# Patient Record
Sex: Male | Born: 1970 | Race: White | Hispanic: No | Marital: Married | Smoking: Never smoker
Health system: Southern US, Community
[De-identification: ages and names within clinical notes are randomized; demographics above are authoritative.]

---

## 2018-06-13 DIAGNOSIS — Z8371 Family history of colonic polyps: Secondary | ICD-10-CM | POA: Insufficient documentation

## 2018-08-15 ENCOUNTER — Ambulatory Visit (HOSPITAL_COMMUNITY)
Admission: EM | Admit: 2018-08-15 | Discharge: 2018-08-15 | Disposition: A | Discharge status: Home / Self Care | Payer: Federal, State, Local not specified - PPO | Attending: Family Medicine | Admitting: Family Medicine

## 2018-08-15 ENCOUNTER — Encounter (HOSPITAL_COMMUNITY): Payer: Self-pay | Admitting: Emergency Medicine

## 2018-08-15 ENCOUNTER — Ambulatory Visit (INDEPENDENT_AMBULATORY_CARE_PROVIDER_SITE_OTHER): Payer: Federal, State, Local not specified - PPO

## 2018-08-15 DIAGNOSIS — J9811 Atelectasis: Secondary | ICD-10-CM | POA: Diagnosis not present

## 2018-08-15 DIAGNOSIS — J22 Unspecified acute lower respiratory infection: Secondary | ICD-10-CM | POA: Diagnosis not present

## 2018-08-15 MED ORDER — BENZONATATE 100 MG PO CAPS: 100.0000 mg | ORAL_CAPSULE | Freq: Three times a day (TID) | ORAL | 0 refills | Status: DC

## 2018-08-15 MED ORDER — AZITHROMYCIN 250 MG PO TABS: 250.0000 mg | ORAL_TABLET | Freq: Every day | ORAL | 0 refills | Status: DC

## 2018-08-15 NOTE — ED Provider Notes (Signed)
[facility]   [id number] 08/15/18 Arrival Time: 1355   CC: Cough  SUBJECTIVE:  Alan Clark is a 47 y.o. male who presents with cough x 1 week ago that has worsened in the last couple of days.  Admits to positive sick exposure to kids at home.  Describes cough as intermittent and sometimes productive with yellow sputum.  Has tried OTC cough medication without relief.  Symptoms are made worse with lying down at night.  Reports previous symptoms in the past. Complains of fever, with Tmax of 102 at home, chills, fatigue, wheezing, and nasal congestion.  Denies sinus pain, rhinorrhea, sore throat, SOB, chest pain, nausea, changes in bowel or bladder habits.    ROS: As per HPI.  History reviewed. No pertinent past medical history. History reviewed. No pertinent surgical history. No Known Allergies No current facility-administered medications on file prior to encounter.    No current outpatient medications on file prior to encounter.    Social History   Socioeconomic History  . Marital status: Married    Spouse name: Not on file  . Number of children: Not on file  . Years of education: Not on file  . Highest education level: Not on file  Occupational History  . Not on file  Social Needs  . Financial resource strain: Not on file  . Food insecurity:    Worry: Not on file    Inability: Not on file  . Transportation needs:    Medical: Not on file    Non-medical: Not on file  Tobacco Use  . Smoking status: Never Smoker  . Smokeless tobacco: Never Used  Substance and Sexual Activity  . Alcohol use: Not on file  . Drug use: Not on file  . Sexual activity: Not on file  Lifestyle  . Physical activity:    Days per week: Not on file    Minutes per session: Not on file  . Stress: Not on file  Relationships  . Social connections:    Talks on phone: Not on file    Gets together: Not on file    Attends religious service: Not on file    Active member of club or  organization: Not on file    Attends meetings of clubs or organizations: Not on file    Relationship status: Not on file  . Intimate partner violence:    Fear of current or ex partner: Not on file    Emotionally abused: Not on file    Physically abused: Not on file    Forced sexual activity: Not on file  Other Topics Concern  . Not on file  Social History Narrative  . Not on file   History reviewed. No pertinent family history.   OBJECTIVE:  Vitals:   08/15/18 1424  BP: 112/68  Pulse: 78  Resp: 16  Temp: 98.2 F (36.8 C)  TempSrc: Oral  SpO2: 98%    General appearance: AOx3 in no acute distress; appears fatigued; nontoxic HEENT: NCAT; EACs clear, TMs pearly gray; PERRL.  EOM grossly intact.  No obvious clear rhinorrhea; tonsils nonerythematous, uvula midline Neck: supple without LAD Lungs: wheezes heard over the right lung field Heart: regular rate and rhythm.  Radial pulses 2+ symmetrical bilaterally Skin: warm and dry Psychological: alert and cooperative; normal mood and affect  DIAGNOSTIC STUDIES:  Dg Chest 2 View  Result Date: 08/15/2018 CLINICAL DATA:  Cough for 2 weeks EXAM: CHEST - 2 VIEW COMPARISON:  None. FINDINGS: There Is atelectatic change  in the right base. There is no edema or consolidation. Heart size and pulmonary vascularity are normal. No adenopathy. No bone lesions. IMPRESSION: Right base atelectasis. No edema or consolidation. No evident adenopathy. Electronically Signed   By: Bretta Bang III M.D.   On: 08/15/2018 15:10    ASSESSMENT & PLAN:  1. Lower respiratory infection (e.g., bronchitis, pneumonia, pneumonitis, pulmonitis)   2. Atelectasis     Meds ordered this encounter  Medications  . azithromycin (ZITHROMAX) 250 MG tablet    Sig: Take 1 tablet (250 mg total) by mouth daily. Take first 2 tablets together, then 1 every day until finished.    Dispense:  6 tablet    Refill:  0    Order Specific Question:   Supervising Provider     Answer:   Isa Rankin [phone]  . benzonatate (TESSALON) 100 MG capsule    Sig: Take 1 capsule (100 mg total) by mouth every 8 (eight) hours.    Dispense:  21 capsule    Refill:  0    Order Specific Question:   Supervising Provider    Answer:   Isa Rankin [[id number]]   Chest xray showed atelectasis  Get plenty of rest and push fluids Use OTC medication as needed for symptomatic relief Take antibiotic as directed and to completion Prescribed tessolone perles as needed for cough Follow up with PCP if symptoms persist Return or go to ER if you have any new or worsening symptoms  Repeat chest x-ray in 6 weeks to ensure atelectasis has resolved  Reviewed expectations re: course of current medical issues. Questions answered. Outlined signs and symptoms indicating need for more acute intervention. Patient verbalized understanding. After Visit Summary given.          Rennis Harding, PA-C 08/15/18 1526

## 2018-08-15 NOTE — ED Notes (Signed)
Bed: UC01 Expected date:  Expected time:  Means of arrival:  Comments: appointments 

## 2018-08-15 NOTE — ED Triage Notes (Signed)
Pt here for URI sx not resolving and fever

## 2018-08-15 NOTE — Discharge Instructions (Addendum)
Chest xray showed atelectasis  Get plenty of rest and push fluids Use OTC medication as needed for symptomatic relief Take antibiotic as directed and to completion Prescribed tessolone perles as needed for cough Follow up with PCP if symptoms persist Return or go to ER if you have any new or worsening symptoms  Repeat chest x-ray in 6 weeks to ensure atelectasis has resolved

## 2020-02-04 ENCOUNTER — Ambulatory Visit
Admission: RE | Admit: 2020-02-04 | Discharge: 2020-02-04 | Disposition: A | Discharge status: Home / Self Care | Payer: Federal, State, Local not specified - PPO | Source: Ambulatory Visit | Attending: Orthopedic Surgery | Admitting: Orthopedic Surgery

## 2020-02-04 ENCOUNTER — Other Ambulatory Visit: Payer: Self-pay

## 2020-02-04 ENCOUNTER — Other Ambulatory Visit: Payer: Self-pay | Admitting: Orthopedic Surgery

## 2020-02-04 DIAGNOSIS — S0542XA Penetrating wound of orbit with or without foreign body, left eye, initial encounter: Secondary | ICD-10-CM

## 2020-02-04 DIAGNOSIS — S0541XA Penetrating wound of orbit with or without foreign body, right eye, initial encounter: Secondary | ICD-10-CM

## 2021-02-18 IMAGING — CR DG ORBITS FOR FOREIGN BODY
2 series · 2 of 2 positions shown · non-contrast
Comparison: None.

CLINICAL DATA: Metal working/exposure; clearance prior to MRI

EXAM:
ORBITS FOR FOREIGN BODY - 2 VIEW

[w orbit pa (1 of 2)]
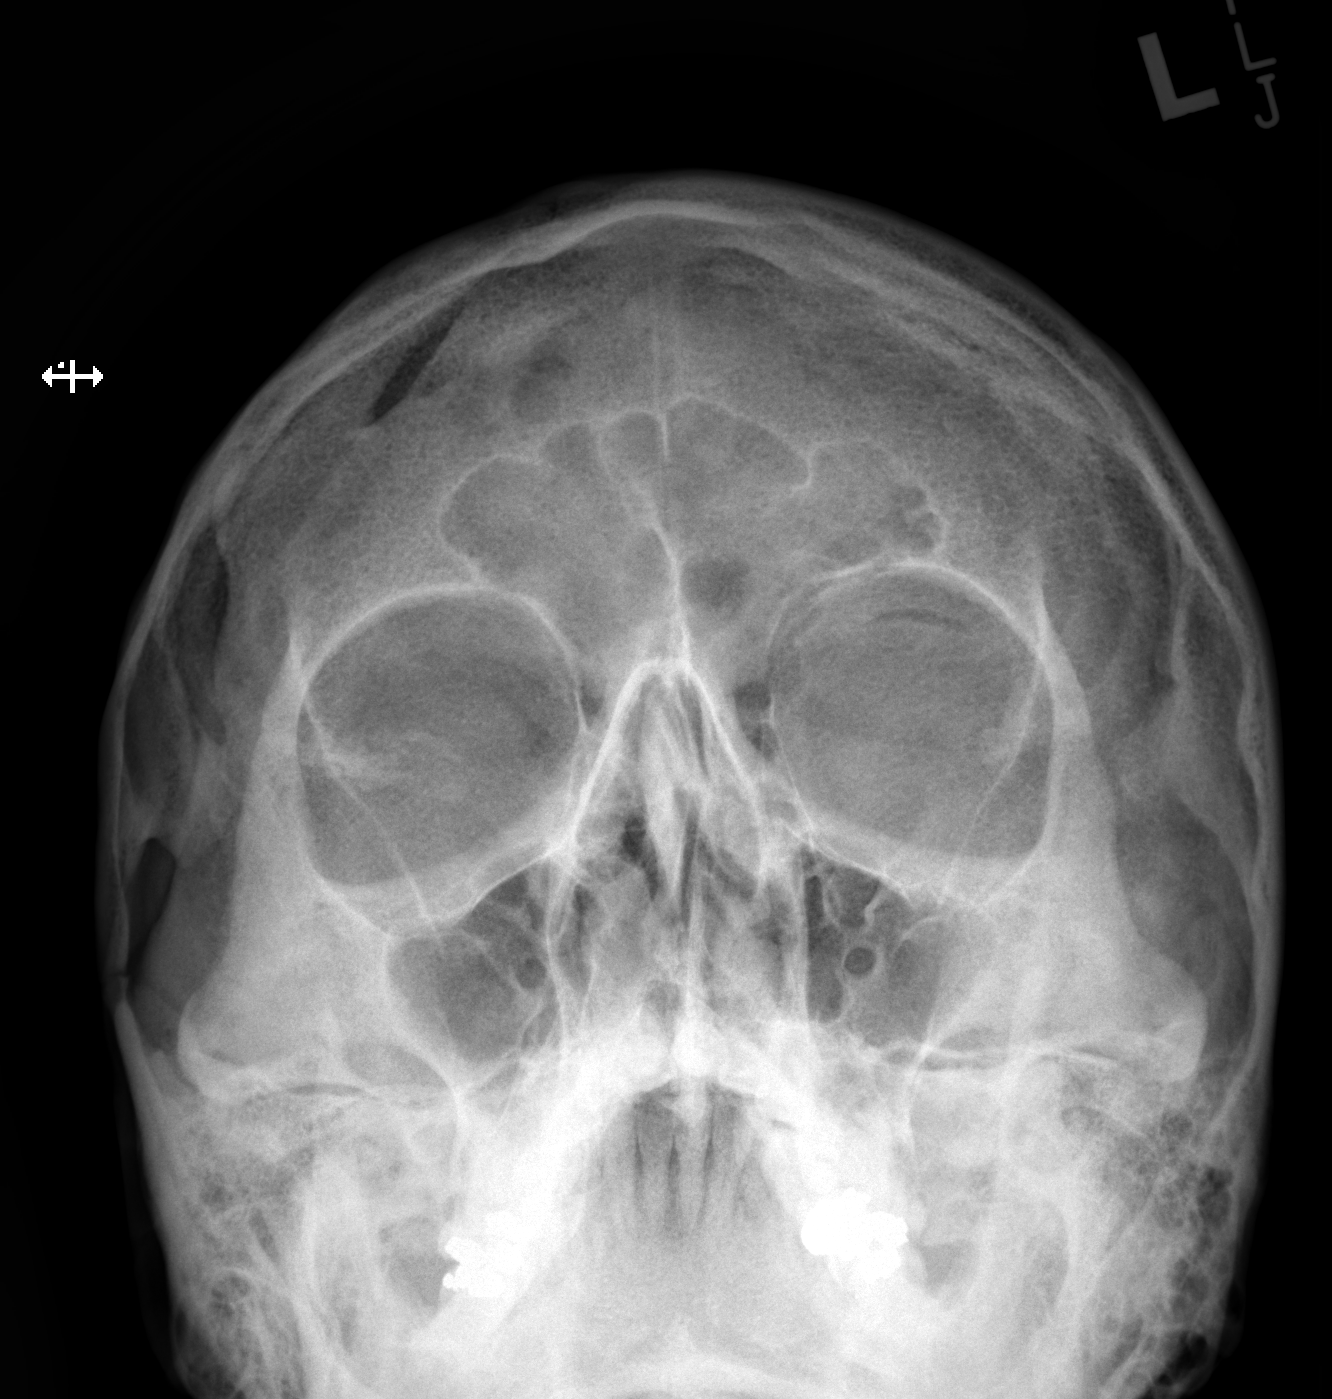

[w orbit pa (2 of 2)]
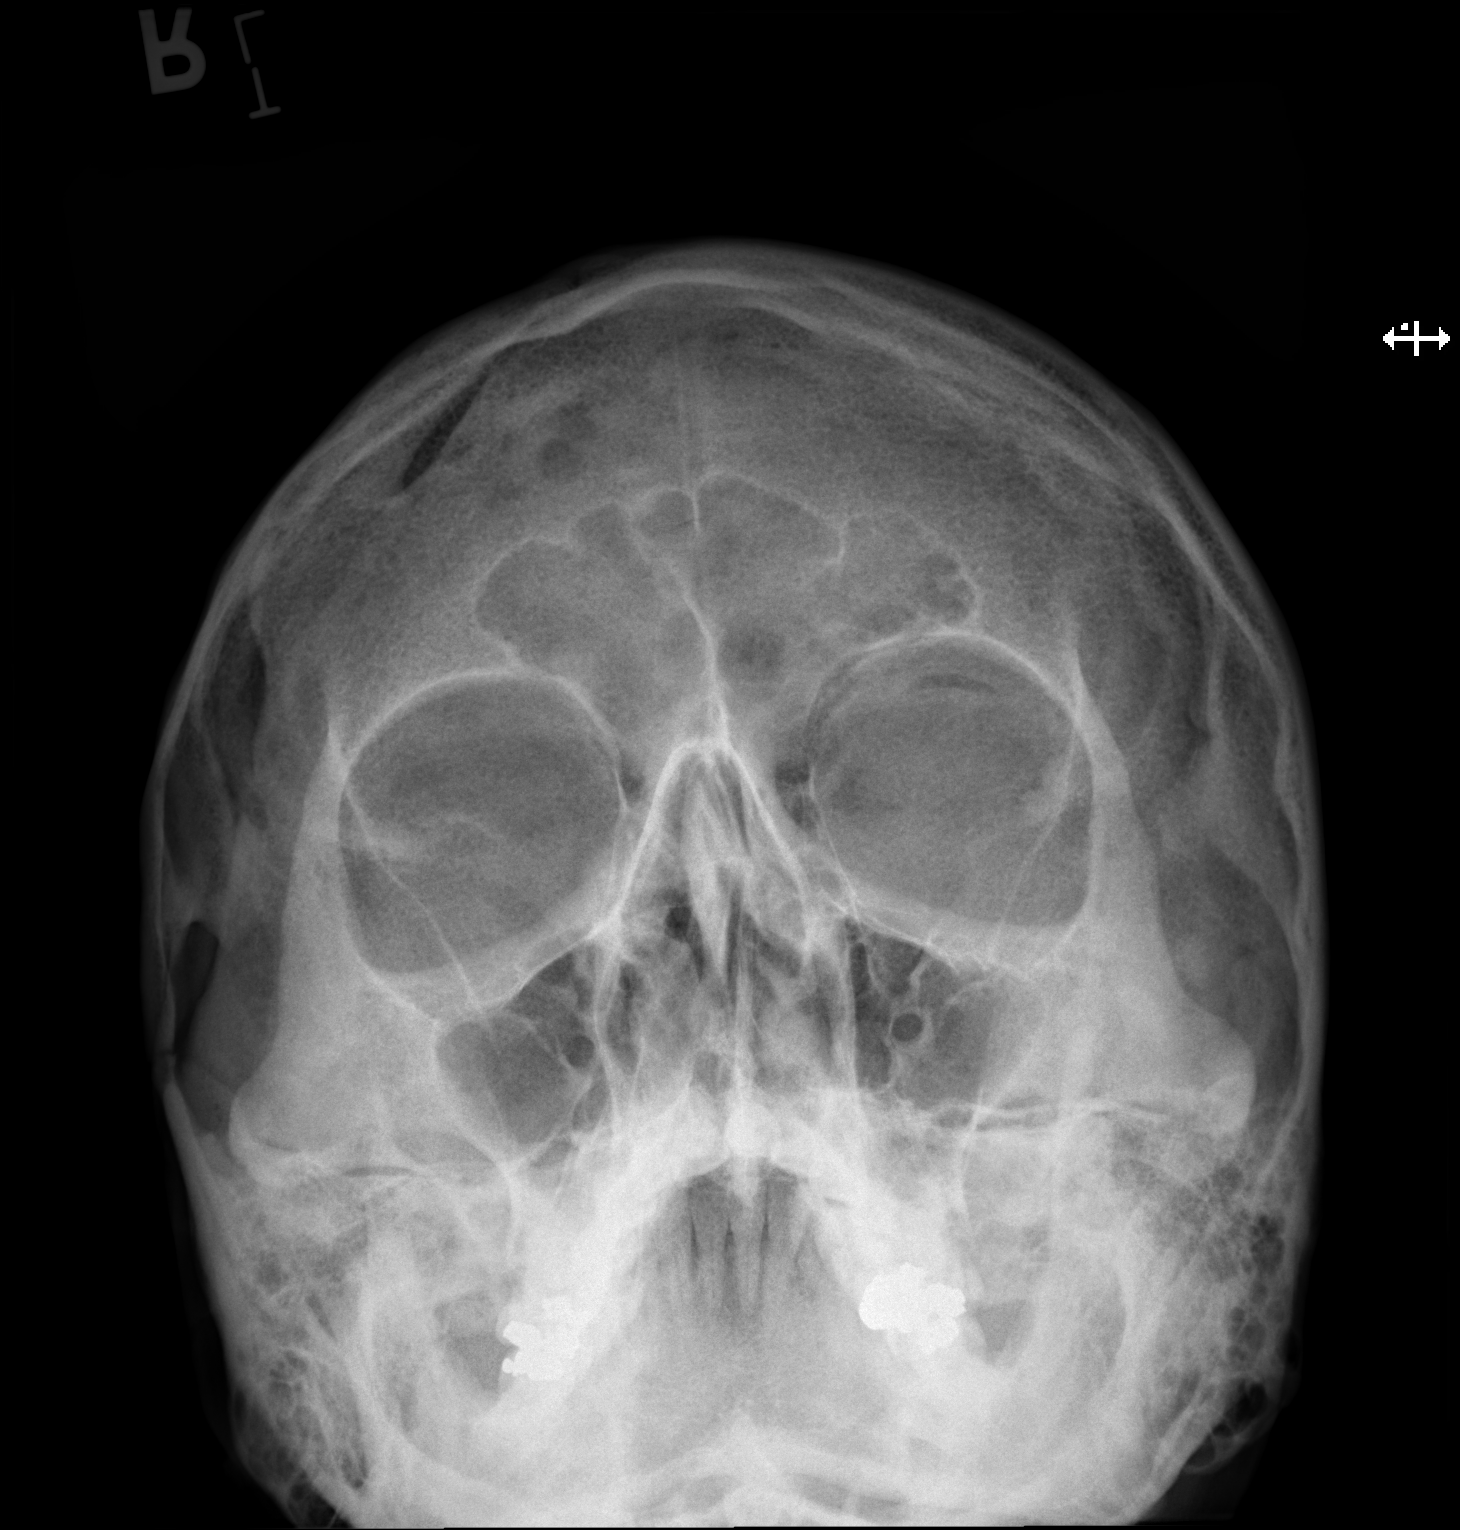

[2 of 2 positions shown; findings below may reference images not displayed]

FINDINGS: There is no evidence of intraorbital metallic foreign body. The
visualized paranasal sinuses are clear. Lucencies projecting over
the right side of the skull may be secondary to previous craniotomy.
IMPRESSION: No evidence of intraorbital metallic foreign body.

## 2022-05-06 ENCOUNTER — Ambulatory Visit: Payer: Self-pay | Admitting: Podiatry

## 2022-05-24 ENCOUNTER — Ambulatory Visit (INDEPENDENT_AMBULATORY_CARE_PROVIDER_SITE_OTHER): Payer: Federal, State, Local not specified - PPO

## 2022-05-24 ENCOUNTER — Ambulatory Visit: Payer: Federal, State, Local not specified - PPO | Admitting: Podiatry

## 2022-05-24 ENCOUNTER — Encounter: Payer: Self-pay | Admitting: Podiatry

## 2022-05-24 DIAGNOSIS — M7751 Other enthesopathy of right foot: Secondary | ICD-10-CM | POA: Diagnosis not present

## 2022-05-24 DIAGNOSIS — M722 Plantar fascial fibromatosis: Secondary | ICD-10-CM | POA: Diagnosis not present

## 2022-05-24 DIAGNOSIS — M7750 Other enthesopathy of unspecified foot: Secondary | ICD-10-CM

## 2022-05-24 NOTE — Patient Instructions (Signed)

## 2022-05-25 NOTE — Progress Notes (Signed)
  Subjective:  Patient ID: Alan Clark, male    DOB: 1971-03-22,  MRN: [id number]  Chief Complaint  Patient presents with   Foot Pain      new pt-right heel pain likely related to repetitive use in tennis and running.  No obvious injury. known family history of blood clots on maternal side.    51 y.o. male presents with the above complaint. History confirmed with patient.  This has been going on for about 1 month.  He is active and likes to run and play tennis.  Objective:  Physical Exam: warm, good capillary refill, no trophic changes or ulcerative lesions, normal DP and PT pulses, and normal sensory exam.  Negative Homan and Pratt sign bilateral Left Foot: normal exam, no swelling, tenderness, instability; ligaments intact, full range of motion of all ankle/foot joints Right Foot: point tenderness over the heel pad, mild and none in the mid fascia    Radiographs: Multiple views x-ray of the right foot: Decreased calcaneal examination, there is no major plantar calcaneal spur noted Assessment:   1. Plantar fasciitis of right foot      Plan:  Patient was evaluated and treated and all questions answered.  Discussed the etiology and treatment options for plantar fasciitis including stretching, formal physical therapy, supportive shoegears such as a running shoe or sneaker, pre fabricated orthoses, injection therapy, and oral medications. We also discussed the role of surgical treatment of this for patients who do not improve after exhausting non-surgical treatment options.   -XR reviewed with patient -Educated patient on stretching and icing of the affected limb -OTC NSAIDs, RICE protocol reviewed -Pain was not severe enough to require corticosteroid injection today.  I discussed with him sugar types and activity level.  I think is okay for him to continue activity at this point.  Most of his pain is during tennis and he feels that his tissues are less supportive than his Altra  running shoes.  We discussed supporting with a custom molded foot orthosis, he was fitted for these today to support the medial longitudinal arch and offload the plantar fascia and heel.  I will see him back in 6 weeks for follow-up and for orthotic pickup at that point  Return in about 6 weeks (around 07/05/2022) for recheck plantar fasciitis, pick up orthotics.

## 2022-06-22 ENCOUNTER — Telehealth: Payer: Self-pay | Admitting: Podiatry

## 2022-06-22 NOTE — Telephone Encounter (Signed)
Left message on vm to schedule picking up orthotics

## 2022-06-28 ENCOUNTER — Ambulatory Visit (INDEPENDENT_AMBULATORY_CARE_PROVIDER_SITE_OTHER): Payer: Federal, State, Local not specified - PPO

## 2022-06-28 DIAGNOSIS — M722 Plantar fascial fibromatosis: Secondary | ICD-10-CM

## 2022-06-28 NOTE — Progress Notes (Signed)
  Orthotics were dispensed and fit was satisfactory. Reviewed instructions for break-in and wear. Written instructions given to patient.  Patient will follow up as needed.   [facility] - order # [id number]

## 2022-08-17 ENCOUNTER — Ambulatory Visit: Payer: Federal, State, Local not specified - PPO | Admitting: Podiatry

## 2022-09-13 ENCOUNTER — Ambulatory Visit: Payer: Federal, State, Local not specified - PPO | Admitting: Podiatry
